# Patient Record
Sex: Male | Born: 1950 | Race: White | Hispanic: No | Marital: Married | Smoking: Never smoker
Health system: Southern US, Community
[De-identification: ages and names within clinical notes are randomized; demographics above are authoritative.]

---

## 1999-08-01 ENCOUNTER — Ambulatory Visit (HOSPITAL_COMMUNITY): Admission: RE | Admit: 1999-08-01 | Discharge: 1999-08-01 | Payer: Self-pay | Admitting: *Deleted

## 2003-07-03 ENCOUNTER — Encounter: Admission: RE | Admit: 2003-07-03 | Discharge: 2003-07-03 | Payer: Self-pay | Admitting: Emergency Medicine

## 2006-06-12 ENCOUNTER — Encounter: Admission: RE | Admit: 2006-06-12 | Discharge: 2006-06-12 | Payer: Self-pay | Admitting: Emergency Medicine

## 2015-11-21 ENCOUNTER — Emergency Department (HOSPITAL_COMMUNITY)
Admission: EM | Admit: 2015-11-21 | Discharge: 2015-11-21 | Disposition: A | Discharge status: Home / Self Care | Payer: Federal, State, Local not specified - PPO | Attending: Emergency Medicine | Admitting: Emergency Medicine

## 2015-11-21 ENCOUNTER — Encounter (HOSPITAL_COMMUNITY): Payer: Self-pay | Admitting: Emergency Medicine

## 2015-11-21 ENCOUNTER — Emergency Department (HOSPITAL_COMMUNITY): Payer: Federal, State, Local not specified - PPO

## 2015-11-21 DIAGNOSIS — Y998 Other external cause status: Secondary | ICD-10-CM | POA: Diagnosis not present

## 2015-11-21 DIAGNOSIS — S61411A Laceration without foreign body of right hand, initial encounter: Secondary | ICD-10-CM | POA: Diagnosis not present

## 2015-11-21 DIAGNOSIS — Y9289 Other specified places as the place of occurrence of the external cause: Secondary | ICD-10-CM | POA: Insufficient documentation

## 2015-11-21 DIAGNOSIS — S61219A Laceration without foreign body of unspecified finger without damage to nail, initial encounter: Secondary | ICD-10-CM

## 2015-11-21 DIAGNOSIS — S6991XA Unspecified injury of right wrist, hand and finger(s), initial encounter: Secondary | ICD-10-CM | POA: Diagnosis present

## 2015-11-21 DIAGNOSIS — W274XXA Contact with kitchen utensil, initial encounter: Secondary | ICD-10-CM | POA: Insufficient documentation

## 2015-11-21 DIAGNOSIS — Y9389 Activity, other specified: Secondary | ICD-10-CM | POA: Diagnosis not present

## 2015-11-21 MED ORDER — LIDOCAINE HCL (PF) 1 % IJ SOLN
20.0000 mL | Freq: Once | INTRAMUSCULAR | Status: DC
  Filled 2015-11-21: qty 20

## 2015-11-21 MED ORDER — LIDOCAINE HCL 1 % IJ SOLN
INTRAMUSCULAR | Status: AC
  Administered 2015-11-21: 20 mL
  Filled 2015-11-21: qty 20

## 2015-11-21 NOTE — ED Notes (Signed)
PT DISCHARGED. INSTRUCTIONS GIVEN. AAOX3. PT IN NO APPARENT DISTRESS OR PAIN. THE OPPORTUNITY TO ASK QUESTIONS WAS PROVIDED. 

## 2015-11-21 NOTE — ED Provider Notes (Signed)
CSN: [id number]     Arrival date & time 11/21/15  2100 History  By signing my name below, I, Doreatha Martinva Mathews, attest that this documentation has been prepared under the direction and in the presence of Marlon Peliffany Jasmond River, PA-C. Electronically Signed: Doreatha MartinEva Mathews, ED Scribe. 11/21/2015. 9:46 PM.    Chief Complaint  Patient presents with  . Extremity Laceration   The history is provided by the patient. No language interpreter was used.   HPI Comments: Edward Lara is a 65 y.o. male who presents to the Emergency Department complaining of a laceration with controlled bleeding to the web space between the 4th and 5th fingers of the right hand that occurred at 7:30PM. Pt states he was washing the dishes when piece of stainless steel slipped and cut him. Bleeding controlled with pressure applied PTA. He is able to move his fingers, but with mild pain. No concern for foreign body. Tdap UTD. Denies numbness, additional injuries.   History reviewed. No pertinent past medical history. No past surgical history on file. History reviewed. No pertinent family history. Social History  Substance Use Topics  . Smoking status: None  . Smokeless tobacco: None  . Alcohol Use: None    Review of Systems  Skin: Positive for wound.  Neurological: Negative for numbness.   Allergies  Review of patient's allergies indicates no known allergies.  Home Medications   Prior to Admission medications   Not on File   BP 183/89 mmHg  Pulse 60  Temp(Src) 97.7 F (36.5 C) (Oral)  Resp 18  Ht 6' (1.829 m)  Wt 87.998 kg  BMI 26.31 kg/m2  SpO2 98% Physical Exam  Constitutional: He is oriented to person, place, and time. He appears well-developed and well-nourished.  HENT:  Head: Normocephalic and atraumatic.  Eyes: Conjunctivae are normal.  Cardiovascular: Normal rate.   Pulmonary/Chest: Effort normal. No respiratory distress.  Abdominal: He exhibits no distension.  Musculoskeletal: Normal range of motion.        Hands: 2 cm laceration to the web space between the 4th and 5th digits of the right hand with hemostasis. Sensation intact distally. Radial pulses 2+ and equal. Capillary refill less than 3 seconds.    Neurological: He is alert and oriented to person, place, and time.  Skin: Skin is warm and dry.  Psychiatric: He has a normal mood and affect. His behavior is normal.  Nursing note and vitals reviewed.   ED Course  Procedures (including critical care time) DIAGNOSTIC STUDIES: Oxygen Saturation is 98% on RA, normal by my interpretation.    COORDINATION OF CARE: 9:40 PM Discussed treatment plan with pt at bedside which includes XR, lac repair and pt agreed to plan.  Imaging Review Dg Hand Complete Right  11/21/2015  CLINICAL DATA:  Cut hand on glass, initial encounter EXAM: RIGHT HAND - COMPLETE 3+ VIEW COMPARISON:  None. FINDINGS: Laceration is noted between the fourth and fifth digits. No radiopaque foreign body is seen. No acute bony abnormality is noted. IMPRESSION: Soft tissue injury without acute bony abnormality. Electronically Signed   By: Alcide CleverMark  Lukens M.D.   On: 11/21/2015 21:43   I have personally reviewed and evaluated these images and lab results as part of my medical decision-making.  LACERATION REPAIR PROCEDURE NOTE The patient's identification was confirmed and consent was obtained. This procedure was performed by Dorthula Matasiffany G [employer], at 10:07 PM. Site: web space b/w 4th and 5th digits of right hand Sterile procedures observed Anesthetic used (type and amt): 1%  lidocaine without epi via local infiltration  Suture type/size: Prolene 6'0 Length: 2 cm  # of Sutures: 5 Technique: simple interrupted Complexity: simple Antibx ointment applied Tetanus UTD  Site anesthetized, irrigated with NS, explored without evidence of foreign body, wound well approximated, site covered with dry, sterile dressing.  Patient tolerated procedure well without complications. Instructions for care  discussed verbally and patient provided with additional written instructions for homecare and f/u.    MDM   Final diagnoses:  Finger laceration, initial encounter    Tetanus UTD. Laceration occurred < 12 hours prior to repair. Discussed laceration care with pt and answered questions. Pt to f-u for suture removal in 10-14 days and wound check sooner should there be signs of dehiscence or infection. Pt is hemodynamically stable with no complaints prior to dc.  Return precautions discussed and outlined in discharge paperwork.   I personally performed the services described in this documentation, which was scribed in my presence. The recorded information has been reviewed and is accurate.   Marlon Pel, PA-C 11/21/15 2212  Melene Plan, DO 11/21/15 2317

## 2015-11-21 NOTE — Discharge Instructions (Signed)
Wound Care °Taking care of your wound properly can help to prevent pain and infection. It can also help your wound to heal more quickly.  °HOW TO CARE FOR YOUR WOUND  °· Take or apply over-the-counter and prescription medicines only as told by your health care provider. °· If you were prescribed antibiotic medicine, take or apply it as told by your health care provider. Do not stop using the antibiotic even if your condition improves. °· Clean the wound each day or as told by your health care provider. °¨ Wash the wound with mild soap and water. °¨ Rinse the wound with water to remove all soap. °¨ Pat the wound dry with a clean towel. Do not rub it. °· There are many different ways to close and cover a wound. For example, a wound can be covered with stitches (sutures), skin glue, or adhesive strips. Follow instructions from your health care provider about: °¨ How to take care of your wound. °¨ When and how you should change your bandage (dressing). °¨ When you should remove your dressing. °¨ Removing whatever was used to close your wound. °· Check your wound every day for signs of infection. Watch for: °¨ Redness, swelling, or pain. °¨ Fluid, blood, or pus. °· Keep the dressing dry until your health care provider says it can be removed. Do not take baths, swim, use a hot tub, or do anything that would put your wound underwater until your health care provider approves. °· Raise (elevate) the injured area above the level of your heart while you are sitting or lying down. °· Do not scratch or pick at the wound. °· Keep all follow-up visits as told by your health care provider. This is important. °SEEK MEDICAL CARE IF: °· You received a tetanus shot and you have swelling, severe pain, redness, or bleeding at the injection site. °· You have a fever. °· Your pain is not controlled with medicine. °· You have increased redness, swelling, or pain at the site of your wound. °· You have fluid, blood, or pus coming from your  wound. °· You notice a bad smell coming from your wound or your dressing. °SEEK IMMEDIATE MEDICAL CARE IF: °· You have a red streak going away from your wound. °  °This information is not intended to replace advice given to you by your health care provider. Make sure you discuss any questions you have with your health care provider. °  °Document Released: 05/09/2008 Document Revised: 12/15/2014 Document Reviewed: 07/27/2014 °Elsevier Interactive Patient Education ©2016 Elsevier Inc. ° °Laceration Care, Adult °A laceration is a cut that goes through all of the layers of the skin and into the tissue that is right under the skin. Some lacerations heal on their own. Others need to be closed with stitches (sutures), staples, skin adhesive strips, or skin glue. Proper laceration care minimizes the risk of infection and helps the laceration to heal better. °HOW TO CARE FOR YOUR LACERATION °If sutures or staples were used: °· Keep the wound clean and dry. °· If you were given a bandage (dressing), you should change it at least one time per day or as told by your health care provider. You should also change it if it becomes wet or dirty. °· Keep the wound completely dry for the first 24 hours or as told by your health care provider. After that time, you may shower or bathe. However, make sure that the wound is not soaked in water until after the sutures or   staples have been removed. °· Clean the wound one time each day or as told by your health care provider: °¨ Wash the wound with soap and water. °¨ Rinse the wound with water to remove all soap. °¨ Pat the wound dry with a clean towel. Do not rub the wound. °· After cleaning the wound, apply a thin layer of antibiotic ointment as told by your health care provider. This will help to prevent infection and keep the dressing from sticking to the wound. °· Have the sutures or staples removed as told by your health care provider. °If skin adhesive strips were used: °· Keep the  wound clean and dry. °· If you were given a bandage (dressing), you should change it at least one time per day or as told by your health care provider. You should also change it if it becomes dirty or wet. °· Do not get the skin adhesive strips wet. You may shower or bathe, but be careful to keep the wound dry. °· If the wound gets wet, pat it dry with a clean towel. Do not rub the wound. °· Skin adhesive strips fall off on their own. You may trim the strips as the wound heals. Do not remove skin adhesive strips that are still stuck to the wound. They will fall off in time. °If skin glue was used: °· Try to keep the wound dry, but you may briefly wet it in the shower or bath. Do not soak the wound in water, such as by swimming. °· After you have showered or bathed, gently pat the wound dry with a clean towel. Do not rub the wound. °· Do not do any activities that will make you sweat heavily until the skin glue has fallen off on its own. °· Do not apply liquid, cream, or ointment medicine to the wound while the skin glue is in place. Using those may loosen the film before the wound has healed. °· If you were given a bandage (dressing), you should change it at least one time per day or as told by your health care provider. You should also change it if it becomes dirty or wet. °· If a dressing is placed over the wound, be careful not to apply tape directly over the skin glue. Doing that may cause the glue to be pulled off before the wound has healed. °· Do not pick at the glue. The skin glue usually remains in place for 5-10 days, then it falls off of the skin. °General Instructions °· Take over-the-counter and prescription medicines only as told by your health care provider. °· If you were prescribed an antibiotic medicine or ointment, take or apply it as told by your doctor. Do not stop using it even if your condition improves. °· To help prevent scarring, make sure to cover your wound with sunscreen whenever you are  outside after stitches are removed, after adhesive strips are removed, or when glue remains in place and the wound is healed. Make sure to wear a sunscreen of at least 30 SPF. °· Do not scratch or pick at the wound. °· Keep all follow-up visits as told by your health care provider. This is important. °· Check your wound every day for signs of infection. Watch for: °¨ Redness, swelling, or pain. °¨ Fluid, blood, or pus. °· Raise (elevate) the injured area above the level of your heart while you are sitting or lying down, if possible. °SEEK MEDICAL CARE IF: °· You received a tetanus   shot and you have swelling, severe pain, redness, or bleeding at the injection site. °· You have a fever. °· A wound that was closed breaks open. °· You notice a bad smell coming from your wound or your dressing. °· You notice something coming out of the wound, such as wood or glass. °· Your pain is not controlled with medicine. °· You have increased redness, swelling, or pain at the site of your wound. °· You have fluid, blood, or pus coming from your wound. °· You notice a change in the color of your skin near your wound. °· You need to change the dressing frequently due to fluid, blood, or pus draining from the wound. °· You develop a new rash. °· You develop numbness around the wound. °SEEK IMMEDIATE MEDICAL CARE IF: °· You develop severe swelling around the wound. °· Your pain suddenly increases and is severe. °· You develop painful lumps near the wound or on skin that is anywhere on your body. °· You have a red streak going away from your wound. °· The wound is on your hand or foot and you cannot properly move a finger or toe. °· The wound is on your hand or foot and you notice that your fingers or toes look pale or bluish. °  °This information is not intended to replace advice given to you by your health care provider. Make sure you discuss any questions you have with your health care provider. °  °Document Released: 07/31/2005  Document Revised: 12/15/2014 Document Reviewed: 07/27/2014 °Elsevier Interactive Patient Education ©2016 Elsevier Inc. ° °

## 2015-11-21 NOTE — ED Notes (Signed)
Pt states that he cut his R hand in between his ring finger and small finger on a glass while washing dishes. Last tetanus shot 4-5 years ago. Alert and oriented.

## 2015-12-04 ENCOUNTER — Ambulatory Visit (INDEPENDENT_AMBULATORY_CARE_PROVIDER_SITE_OTHER): Payer: Federal, State, Local not specified - PPO | Admitting: Physician Assistant

## 2015-12-04 VITALS — BP 130/76 | HR 61 | Temp 98.1°F | Resp 14 | Ht 72.0 in | Wt 179.0 lb

## 2015-12-04 DIAGNOSIS — Z4802 Encounter for removal of sutures: Secondary | ICD-10-CM

## 2015-12-04 NOTE — Patient Instructions (Signed)
     IF you received an x-ray today, you will receive an invoice from [facility]. Please contact [facility] at [phone] with questions or concerns regarding your invoice.   IF you received labwork today, you will receive an invoice from [facility]. Please contact [facility] at [phone] with questions or concerns regarding your invoice.   Our billing staff will not be able to assist you with questions regarding bills from these companies.  You will be contacted with the lab results as soon as they are available. The fastest way to get your results is to activate your My Chart account. Instructions are located on the last page of this paperwork. If you have not heard from us regarding the results in 2 weeks, please contact this office.      

## 2015-12-04 NOTE — Progress Notes (Signed)
Chief Complaint  Patient presents with  . Suture / Staple Removal    History of Present Illness: Patient presents for suture removal.  The patient presented to the ED on 11/21/2015 after accidentally cutting the webspace between the 4th and 5th fingers on the RIGHT hand while washing dishes. The wound was closed with #5 SI Prolene sutures.  He denies pain and drainage, fever and chills.  Yesterday he noted the development of a little erythema and edema at the site, which he believes is reaction to the sutures.   No Known Allergies  Prior to Admission medications   Not on File    There are no active problems to display for this patient.    Physical Exam  Constitutional: He is oriented to person, place, and time. He appears well-developed and well-nourished. He is active and cooperative. No distress.  BP 130/76 mmHg  Pulse 61  Temp(Src) 98.1 F (36.7 C) (Oral)  Resp 14  Ht 6' (1.829 m)  Wt 179 lb (81.194 kg)  BMI 24.27 kg/m2  SpO2 98%   Eyes: Conjunctivae are normal.  Pulmonary/Chest: Effort normal.  Neurological: He is alert and oriented to person, place, and time.  Skin: Skin is warm and dry. Laceration (#5 SI Prolene sutures are intact. Minimal erythema/edema, consistent with suture reaction. no drainage. non-tender.) noted.  Sutures removed without incident. The wound edges were mildly inverted, but deeper in the wound, the tissue is well healed.  Psychiatric: He has a normal mood and affect. His speech is normal and behavior is normal.      ASSESSMENT & PLAN:  1. Visit for suture removal Anticipatory guidance provided. Local wound care.   Fernande Brashelle S. Nakiea Metzner, PA-C Physician Assistant-Certified [facility]

## 2016-02-05 ENCOUNTER — Emergency Department (HOSPITAL_COMMUNITY): Payer: Federal, State, Local not specified - PPO

## 2016-02-05 ENCOUNTER — Encounter (HOSPITAL_COMMUNITY): Payer: Self-pay | Admitting: *Deleted

## 2016-02-05 ENCOUNTER — Emergency Department (HOSPITAL_COMMUNITY)
Admission: EM | Admit: 2016-02-05 | Discharge: 2016-02-05 | Disposition: A | Discharge status: Home / Self Care | Payer: Federal, State, Local not specified - PPO | Attending: Emergency Medicine | Admitting: Emergency Medicine

## 2016-02-05 DIAGNOSIS — Y939 Activity, unspecified: Secondary | ICD-10-CM | POA: Diagnosis not present

## 2016-02-05 DIAGNOSIS — W271XXA Contact with garden tool, initial encounter: Secondary | ICD-10-CM | POA: Insufficient documentation

## 2016-02-05 DIAGNOSIS — S91031A Puncture wound without foreign body, right ankle, initial encounter: Secondary | ICD-10-CM | POA: Insufficient documentation

## 2016-02-05 DIAGNOSIS — L03115 Cellulitis of right lower limb: Secondary | ICD-10-CM | POA: Diagnosis not present

## 2016-02-05 DIAGNOSIS — Y998 Other external cause status: Secondary | ICD-10-CM | POA: Diagnosis not present

## 2016-02-05 DIAGNOSIS — T148XXA Other injury of unspecified body region, initial encounter: Secondary | ICD-10-CM

## 2016-02-05 DIAGNOSIS — Y92007 Garden or yard of unspecified non-institutional (private) residence as the place of occurrence of the external cause: Secondary | ICD-10-CM | POA: Diagnosis not present

## 2016-02-05 DIAGNOSIS — L0390 Cellulitis, unspecified: Secondary | ICD-10-CM

## 2016-02-05 MED ORDER — AMOXICILLIN-POT CLAVULANATE 875-125 MG PO TABS: 1.0000 | ORAL_TABLET | Freq: Two times a day (BID) | ORAL | Status: AC

## 2016-02-05 MED ORDER — TETANUS-DIPHTH-ACELL PERTUSSIS 5-2.5-18.5 LF-MCG/0.5 IM SUSP
0.5000 mL | Freq: Once | INTRAMUSCULAR | Status: AC
  Administered 2016-02-05: 0.5 mL via INTRAMUSCULAR
  Filled 2016-02-05: qty 0.5

## 2016-02-05 MED ORDER — AMOXICILLIN-POT CLAVULANATE 875-125 MG PO TABS
1.0000 | ORAL_TABLET | Freq: Once | ORAL | Status: AC
  Administered 2016-02-05: 1 via ORAL
  Filled 2016-02-05: qty 1

## 2016-02-05 NOTE — Discharge Instructions (Signed)
The x-ray shows no fracture or retained foreign body  You have been started on antibiotic .  Please take this as directed until all tablets have been completed .   The area of redness in the posterior ankle has been outlined if the redness extends to or beyond this line, you develop fever , body aches , please return for further evaluation and treatment   As make an appointment with your primary care physician for follow-up   Cellulitis Cellulitis is an infection of the skin and the tissue under the skin. The infected area is usually red and tender. This happens most often in the arms and lower legs. HOME CARE   Take your antibiotic medicine as told. Finish the medicine even if you start to feel better.  Keep the infected arm or leg raised (elevated).  Put a warm cloth on the area up to 4 times per day.  Only take medicines as told by your doctor.  Keep all doctor visits as told. GET HELP IF:  You see red streaks on the skin coming from the infected area.  Your red area gets bigger or turns a dark color.  Your bone or joint under the infected area is painful after the skin heals.  Your infection comes back in the same area or different area.  You have a puffy (swollen) bump in the infected area.  You have new symptoms.  You have a fever. GET HELP RIGHT AWAY IF:   You feel very sleepy.  You throw up (vomit) or have watery poop (diarrhea).  You feel sick and have muscle aches and pains.   This information is not intended to replace advice given to you by your health care provider. Make sure you discuss any questions you have with your health care provider.   Document Released: 01/17/2008 Document Revised: 04/21/2015 Document Reviewed: 10/16/2011 Elsevier Interactive Patient Education [employer]2016 Elsevier Inc.

## 2016-02-05 NOTE — ED Provider Notes (Signed)
CSN: [id number]     Arrival date & time 02/05/16  1334 History  By signing my name below, I, Ronney LionSuzanne Le, attest that this documentation has been prepared under the direction and in the presence of Earley FavorGail Rosalio Catterton, NP. Electronically Signed: Ronney LionSuzanne Le, ED Scribe. 02/05/2016. 2:57 PM.    Chief Complaint  Patient presents with  . Wound Infection   The history is provided by the patient. No language interpreter was used.    HPI Comments: Edward Lara is a 65 y.o. male who presents to the Emergency Department complaining of gradual-onset, constant, worsening, moderate, redness, swelling, and pain to his right anterolateral ankle after accidentally stabbing his foot with a pitchfork 4 days ago. He states his pain has been worsening since onset, and he reports this morning, he felt unable to bear weight secondary to his pain. Patient states he had not tried anything for his symptoms. He denies fever, chills, or generalized myalgias.    [facility]  History reviewed. No pertinent past medical history. History reviewed. No pertinent past surgical history. No family history on file. Social History  Substance Use Topics  . Smoking status: Never Smoker   . Smokeless tobacco: None  . Alcohol Use: None    Review of Systems  Constitutional: Negative for fever and chills.  Musculoskeletal: Negative for myalgias.  Skin: Positive for color change (redness) and wound.  All other systems reviewed and are negative.  Allergies  Review of patient's allergies indicates no known allergies.  Home Medications   Prior to Admission medications   Not on File   BP 157/95 mmHg  Pulse 95  Temp(Src) 98.1 F (36.7 C)  Resp 18  SpO2 97% Physical Exam  Constitutional: He is oriented to person, place, and time. He appears well-developed and well-nourished. No distress.  HENT:  Head: Normocephalic and atraumatic.  Eyes: Conjunctivae and EOM are normal.  Neck: Neck supple. No tracheal deviation  present.  Cardiovascular: Normal rate.   Pulmonary/Chest: Effort normal. No respiratory distress.  Musculoskeletal: Normal range of motion.  Neurological: He is alert and oriented to person, place, and time.  Skin: Skin is warm and dry.  Psychiatric: He has a normal mood and affect. His behavior is normal.  Nursing note and vitals reviewed.   ED Course  Procedures (including critical care time)  DIAGNOSTIC STUDIES: Oxygen Saturation is 97% on RA, normal by my interpretation.    COORDINATION OF CARE: 2:19 PM - Discussed treatment plan with pt at bedside which includes Augmentin, x-ray of right lower extremity to rule out fracture, and update of Tdap. Offered pain medication; pt declined, stating, "I almost never take medicine for my pain." Pt verbalized understanding and agreed to plan.   Imaging Review No results found. I have personally reviewed and evaluated these images and lab results as part of my medical decision-making. *Shows no foreign body retention.  No fracture.  Patient will be started on Augmentin.  The area of redness has been outlined.  He's been instructed to return if the erythema extends past this area develops myalgias, fevers or increased pain in the ankle joint itself  MDM   Final diagnoses:  None    I personally performed the services described in this documentation, which was scribed in my presence. The recorded information has been reviewed and is accurate.    Earley FavorGail Carey Johndrow, NP 02/05/16 1526  Lyndal Pulleyaniel Knott, MD 02/07/16 [phone]

## 2016-02-05 NOTE — ED Notes (Signed)
Pt complains of redness and swelling to right foot. Pt states he has a puncture would from a pitchfork 4 days ago. Pt denies drainage from wound.

## 2016-08-27 ENCOUNTER — Emergency Department (HOSPITAL_COMMUNITY)
Admission: EM | Admit: 2016-08-27 | Discharge: 2016-08-27 | Disposition: A | Discharge status: Home / Self Care | Payer: Federal, State, Local not specified - PPO | Attending: Emergency Medicine | Admitting: Emergency Medicine

## 2016-08-27 ENCOUNTER — Encounter (HOSPITAL_COMMUNITY): Payer: Self-pay | Admitting: Emergency Medicine

## 2016-08-27 DIAGNOSIS — R55 Syncope and collapse: Secondary | ICD-10-CM | POA: Diagnosis not present

## 2016-08-27 DIAGNOSIS — Z79899 Other long term (current) drug therapy: Secondary | ICD-10-CM | POA: Insufficient documentation

## 2016-08-27 DIAGNOSIS — F439 Reaction to severe stress, unspecified: Secondary | ICD-10-CM | POA: Diagnosis not present

## 2016-08-27 LAB — URINALYSIS, ROUTINE W REFLEX MICROSCOPIC
Bilirubin Urine: NEGATIVE
Glucose, UA: NEGATIVE mg/dL
Hgb urine dipstick: NEGATIVE
Ketones, ur: NEGATIVE mg/dL
Leukocytes, UA: NEGATIVE
Nitrite: NEGATIVE
Protein, ur: NEGATIVE mg/dL
Specific Gravity, Urine: 1.023 (ref 1.005–1.030)
pH: 5 (ref 5.0–8.0)

## 2016-08-27 LAB — CBC
HCT: 43.8 % (ref 39.0–52.0)
Hemoglobin: 15.4 g/dL (ref 13.0–17.0)
MCH: 30.8 pg (ref 26.0–34.0)
MCHC: 35.2 g/dL (ref 30.0–36.0)
MCV: 87.6 fL (ref 78.0–100.0)
Platelets: 253 10*3/uL (ref 150–400)
RBC: 5 MIL/uL (ref 4.22–5.81)
RDW: 12.7 % (ref 11.5–15.5)
WBC: 11.7 10*3/uL — ABNORMAL HIGH (ref 4.0–10.5)

## 2016-08-27 LAB — BASIC METABOLIC PANEL
Anion gap: 8 (ref 5–15)
BUN: 22 mg/dL — ABNORMAL HIGH (ref 6–20)
CO2: 28 mmol/L (ref 22–32)
Calcium: 9.4 mg/dL (ref 8.9–10.3)
Chloride: 104 mmol/L (ref 101–111)
Creatinine, Ser: 0.94 mg/dL (ref 0.61–1.24)
GFR calc Af Amer: >60 mL/min (ref >60)
GFR calc non Af Amer: >60 mL/min (ref >60)
Glucose, Bld: 106 mg/dL — ABNORMAL HIGH (ref 65–99)
Potassium: 3.9 mmol/L (ref 3.5–5.1)
Sodium: 140 mmol/L (ref 135–145)

## 2016-08-27 NOTE — ED Provider Notes (Addendum)
[facility]-EMERGENCY DEPT Provider Note   CSN: [id number] Arrival date & time: 08/27/16  1641     History   Chief Complaint Chief Complaint  Patient presents with  . Loss of Consciousness    HPI Edward Lara is a 66 y.o. male.  He is here for evaluation of an episode of syncope which occurred yesterday morning. He recalls being diaphoretic, and feeling dizzy, just prior to passing out. He was with his wife at the time and she states he was out for "a few seconds". Subsequent to that, he was able to carry out his usual daily activities including eating, walking, sleeping, and observations both in and outside of the house. After passing out the patient was worried that he was dying, so he and his wife talked about that and he showed her where some papers would be if she needed them. The patient reports having stress because his 20 year old, mother is requiring more assistance, and may need to be placed. He is her primary caregiver. He admits to smoking some marijuana prior to the episode of syncope. There are no other known modifying factors.   HPI  History reviewed. No pertinent past medical history.  There are no active problems to display for this patient.   History reviewed. No pertinent surgical history.     Home Medications    Prior to Admission medications   Medication Sig Start Date End Date Taking? Authorizing Provider  amoxicillin-clavulanate (AUGMENTIN) 875-125 MG tablet Take 1 tablet by mouth 2 (two) times daily. 02/05/16   Earley Favor, NP    Family History History reviewed. No pertinent family history.  Social History Social History  Substance Use Topics  . Smoking status: Never Smoker  . Smokeless tobacco: Not on file  . Alcohol use Not on file     Allergies   Patient has no known allergies.   Review of Systems Review of Systems   Physical Exam Updated Vital Signs BP 130/68   Pulse 60   Temp 98.3 F (36.8 C) (Oral)   Resp 18   SpO2 98%    Physical Exam  Constitutional: He is oriented to person, place, and time. He appears well-developed and well-nourished.  HENT:  Head: Normocephalic and atraumatic.  Right Ear: External ear normal.  Left Ear: External ear normal.  Eyes: Conjunctivae and EOM are normal. Pupils are equal, round, and reactive to light.  Neck: Normal range of motion and phonation normal. Neck supple.  Cardiovascular: Normal rate, regular rhythm and normal heart sounds.   Pulmonary/Chest: Effort normal and breath sounds normal. He exhibits no bony tenderness.  Abdominal: Soft. There is no tenderness.  Musculoskeletal: Normal range of motion.  Neurological: He is alert and oriented to person, place, and time. No cranial nerve deficit or sensory deficit. He exhibits normal muscle tone. Coordination normal.  Skin: Skin is warm, dry and intact.  Psychiatric: He has a normal mood and affect. His behavior is normal. Judgment and thought content normal.  Nursing note and vitals reviewed.    ED Treatments / Results  Labs (all labs ordered are listed, but only abnormal results are displayed) Labs Reviewed  BASIC METABOLIC PANEL - Abnormal; Notable for the following:       Result Value   Glucose, Bld 106 (*)    BUN 22 (*)    All other components within normal limits  CBC - Abnormal; Notable for the following:    WBC 11.7 (*)    All other components within normal  limits  URINALYSIS, ROUTINE W REFLEX MICROSCOPIC    EKG  EKG Interpretation  Date/Time:  Sunday August 27 2016 17:04:30 EST Ventricular Rate:  61 PR Interval:    QRS Duration: 114 QT Interval:  417 QTC Calculation: 420 R Axis:   71 Text Interpretation:  Sinus rhythm Borderline intraventricular conduction delay No old tracing to compare Confirmed by [facility]  MD, Morley Gaumer [phone]([id number]) on 08/27/2016 5:55:41 PM Also confirmed by Effie ShyWENTZ  MD, Treg Diemer [phone]), editor Stout CT, Jola BabinskiMarilyn [phone]([id number])  on 08/28/2016 8:07:19 AM       Radiology No results  found.  Procedures Procedures (including critical care time)  Medications Ordered in ED Medications - No data to display   Initial Impression / Assessment and Plan / ED Course  I have reviewed the triage vital signs and the nursing notes.  Pertinent labs & imaging results that were available during my care of the patient were reviewed by me and considered in my medical decision making (see chart for details).  Clinical Course as of Sep 13 1638  Wynelle LinkSun Aug 27, 2016  2217 EKG 12-Lead [EW]  2218 EKG 12-Lead [EW]    Clinical Course User Index [EW] Mancel BaleElliott Ramia Sidney, MD    Medications - No data to display  No data found.   10:46 PM Reevaluation with update and discussion. After initial assessment and treatment, an updated evaluation reveals no change in clinical status. Findings discussed with patient and wife, all questions were answered. [name]    Final Clinical Impressions(s) / ED Diagnoses   Final diagnoses:  Vasovagal syncope  Stress    Syncope, likely vasovagal in origin, associated with stress, unlikely to be caused from smoking marijuana. Evaluation for CNS or cardiac problems is negative. No indication for further evaluation or treatment in the ED, or hospital setting. Mild blood pressure elevation, unlikely to be causative or problematic.  Nursing Notes Reviewed/ Care Coordinated Applicable Imaging Reviewed Interpretation of Laboratory Data incorporated into ED treatment  The patient appears reasonably screened and/or stabilized for discharge and I doubt any other medical condition or other [facility] requiring further screening, evaluation, or treatment in the ED at this time prior to discharge.  Plan: Home Medications- continue; Home Treatments- rest; return here if the recommended treatment, does not improve the symptoms; Recommended follow up- PCP 1-2 weeks for BP check    New Prescriptions Discharge Medication List as of 08/27/2016 10:57 PM       Mancel BaleElliott  Myeesha Shane, MD 08/27/16 2249    Mancel BaleElliott Alyxis Grippi, MD 09/13/16 [phone]

## 2016-08-27 NOTE — Discharge Instructions (Signed)
If you feel dizzy, sat down.  Make sure that you're getting plenty to eat and drink.  Follow-up for a blood pressure check with your PCP, in 1 or 2 weeks.

## 2016-08-27 NOTE — ED Triage Notes (Addendum)
Pt c/o syncopal episode with diaphoresis x several seconds yesterday morning on waking up. Prior to syncope felt lightheaded, diaphoresis. No pain. Has had unusually high stress levels. Recent URI x past few days, resolving.

## 2019-10-05 ENCOUNTER — Ambulatory Visit: Payer: Federal, State, Local not specified - PPO | Attending: Internal Medicine

## 2019-10-05 DIAGNOSIS — Z23 Encounter for immunization: Secondary | ICD-10-CM | POA: Insufficient documentation

## 2019-10-05 NOTE — Progress Notes (Signed)
   Covid-19 Vaccination Clinic  Name:  Edward Lara    MRN: [id number] DOB: Dec 13, 1950  10/05/2019  [name] was observed post Covid-19 immunization for 15 minutes without incidence. He was provided with Vaccine Information Sheet and instruction to access the V-Safe system.   [name] was instructed to call 911 with any severe reactions post vaccine: Marland Kitchen Difficulty breathing  . Swelling of your face and throat  . A fast heartbeat  . A bad rash all over your body  . Dizziness and weakness    Immunizations Administered    Name Date Dose VIS Date Route   Pfizer COVID-19 Vaccine 10/05/2019  8:48 AM 0.3 mL 07/25/2019 Intramuscular   Manufacturer: [employer], [employer]   Lot: [id number]   NDC: [address]-8

## 2019-10-29 ENCOUNTER — Ambulatory Visit: Payer: Federal, State, Local not specified - PPO | Attending: Internal Medicine

## 2019-10-29 DIAGNOSIS — Z23 Encounter for immunization: Secondary | ICD-10-CM

## 2019-10-29 NOTE — Progress Notes (Signed)
   Covid-19 Vaccination Clinic  Name:  KARAM DUNSON    MRN: [id number] DOB: [birth year]  10/29/2019  Mr. Ouellet was observed post Covid-19 immunization for 15 minutes without incident. He was provided with Vaccine Information Sheet and instruction to access the V-Safe system.   Mr. Cicero was instructed to call 911 with any severe reactions post vaccine: Marland Kitchen Difficulty breathing  . Swelling of face and throat  . A fast heartbeat  . A bad rash all over body  . Dizziness and weakness   Immunizations Administered    Name Date Dose VIS Date Route   Pfizer COVID-19 Vaccine 10/29/2019  8:23 AM 0.3 mL 07/25/2019 Intramuscular   Manufacturer: [employer], [employer]   Lot: [id number]   NDC: [address]-3

## 2021-04-29 ENCOUNTER — Other Ambulatory Visit (HOSPITAL_BASED_OUTPATIENT_CLINIC_OR_DEPARTMENT_OTHER): Payer: Self-pay | Admitting: Family Medicine

## 2021-04-29 DIAGNOSIS — E7800 Pure hypercholesterolemia, unspecified: Secondary | ICD-10-CM

## 2021-05-02 ENCOUNTER — Other Ambulatory Visit: Payer: Self-pay

## 2021-05-02 ENCOUNTER — Ambulatory Visit (HOSPITAL_BASED_OUTPATIENT_CLINIC_OR_DEPARTMENT_OTHER)
Admission: RE | Admit: 2021-05-02 | Discharge: 2021-05-02 | Disposition: A | Discharge status: Home / Self Care | Payer: Federal, State, Local not specified - PPO | Source: Ambulatory Visit | Attending: Family Medicine | Admitting: Family Medicine

## 2021-05-02 DIAGNOSIS — E7800 Pure hypercholesterolemia, unspecified: Secondary | ICD-10-CM | POA: Insufficient documentation

## 2023-06-03 IMAGING — CT CT CARDIAC CORONARY ARTERY CALCIUM SCORE
3 series · 14 of 20 positions shown, 16 images · non-contrast
Comparison: None.
COMPARISON: None.

Addendum:
EXAM:
OVER-READ INTERPRETATION  CT CHEST

The following report is an over-read performed by radiologist Dr.
Giorgi Jumper [REDACTED] on 05/02/2021. This
over-read does not include interpretation of cardiac or coronary
anatomy or pathology. The coronary calcium score interpretation by
the cardiologist is attached.
CLINICAL DATA: Risk stratification
Coronary Calcium Score
TECHNIQUE: The patient was scanned on a Siemens Somatom 64 slice scanner. Axial
non-contrast 3 mm slices were carried out through the heart. The
data set was analyzed on a dedicated work station and scored using
the Agatson method.

[Series 2: ax lung · axial · 0.86mm/px · z∈[+827,+939]mm · 5 of 86 slices shown]
[im 15/86  lung]
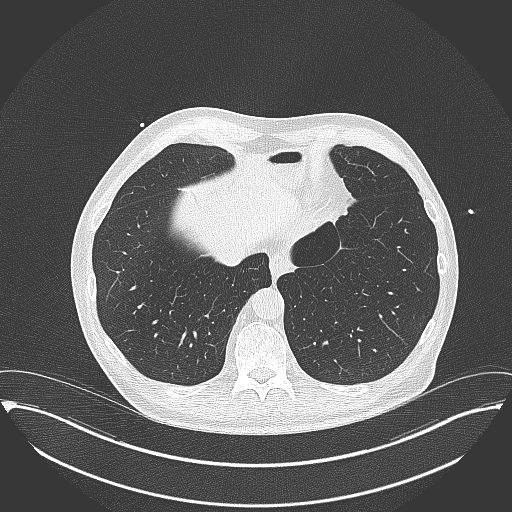
[im 29/86  lung]
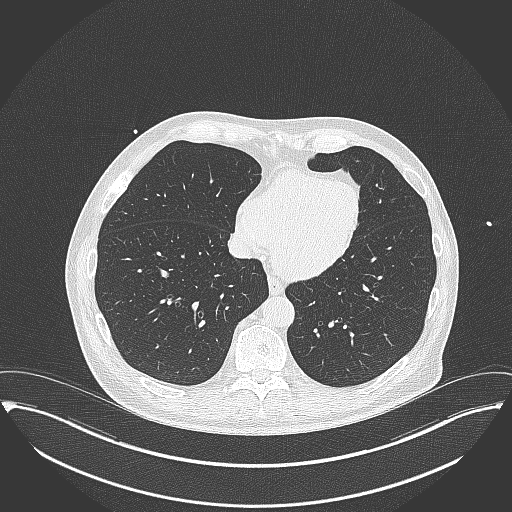
[im 43/86  lung]
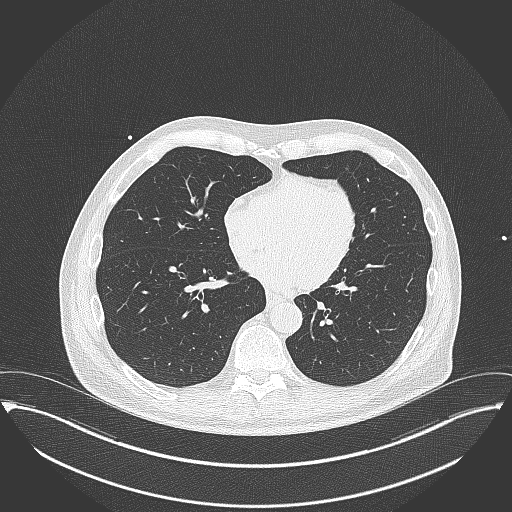
[im 57/86  lung]
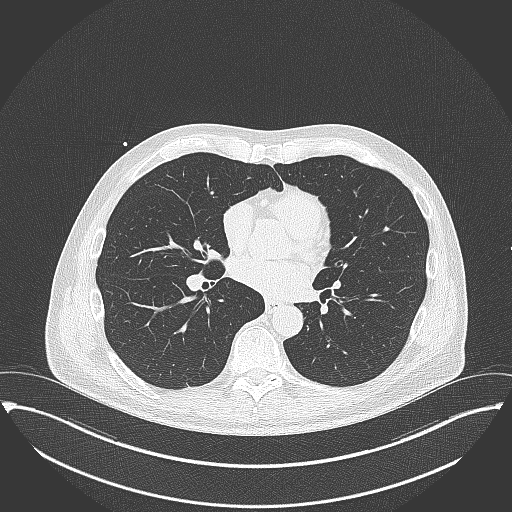
[im 71/86  lung]
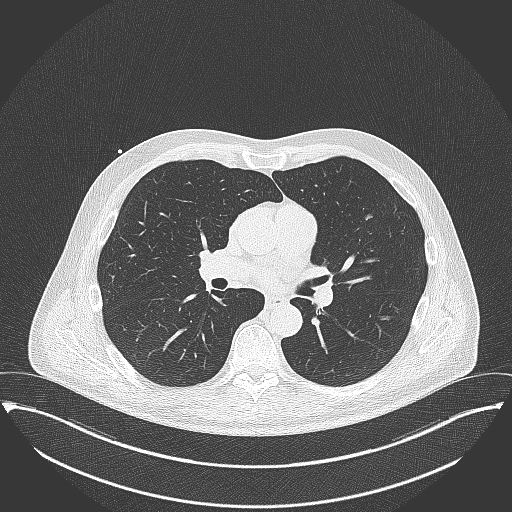

[Series 3: cascseq 3.0 sa36 70% (id) · axial · 0.38mm/px · z∈[+839,+920]mm · 3 of 55 slices shown]
[im 14/55  vessel]
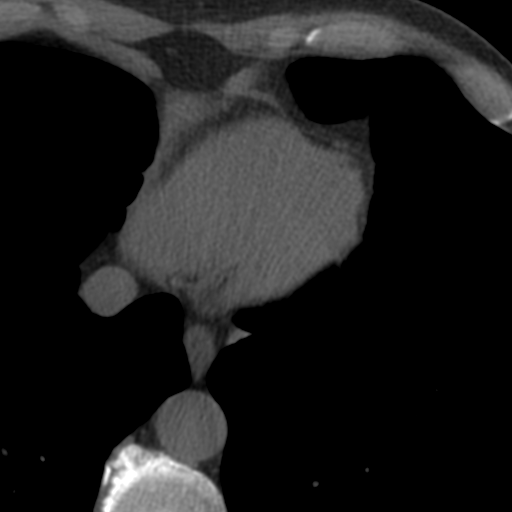
[im 28/55  vessel]
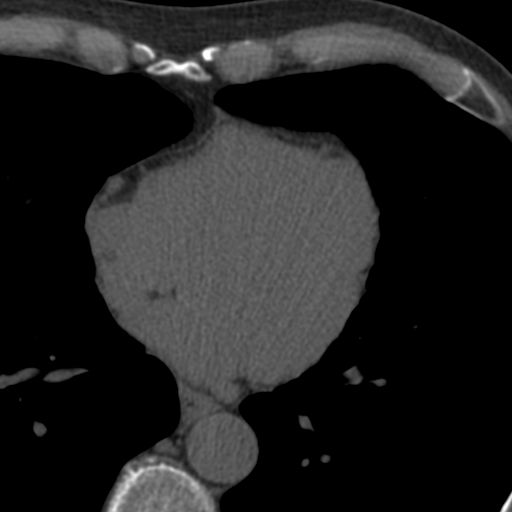
[im 41/55  vessel]
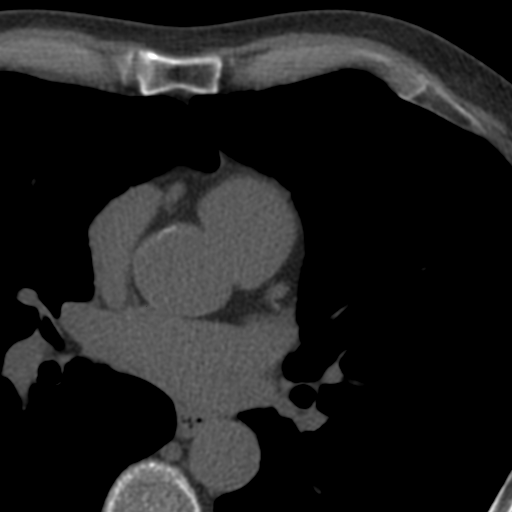

[Series 4: ax st · axial · 0.66mm/px · z∈[+823,+943]mm · 6 of 86 slices shown, 8 images]
[im 13/86  vessel]
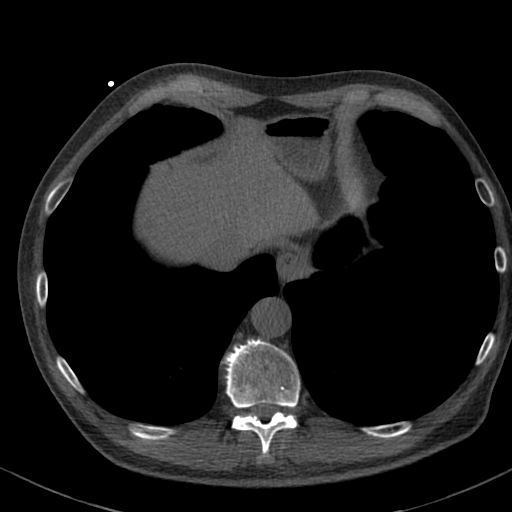
[im 13/86  lung]
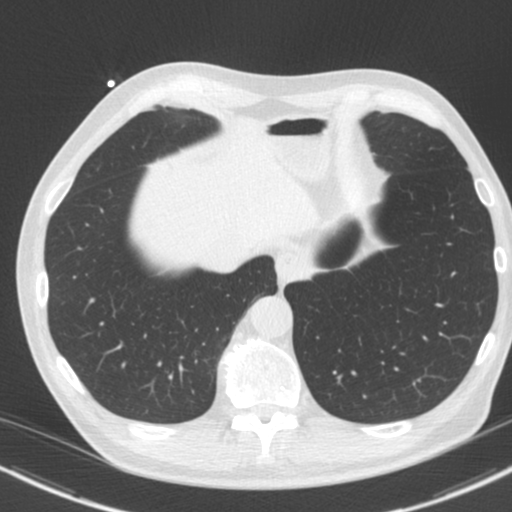
[im 25/86  vessel]
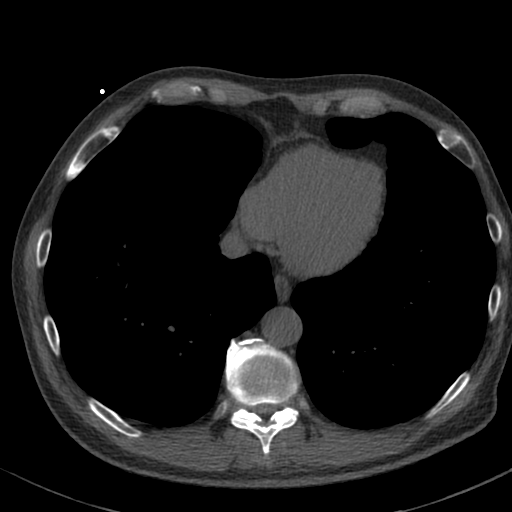
[im 37/86  vessel]
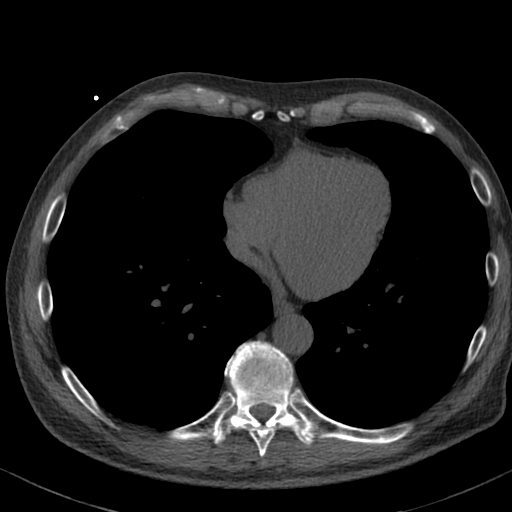
[im 49/86  vessel]
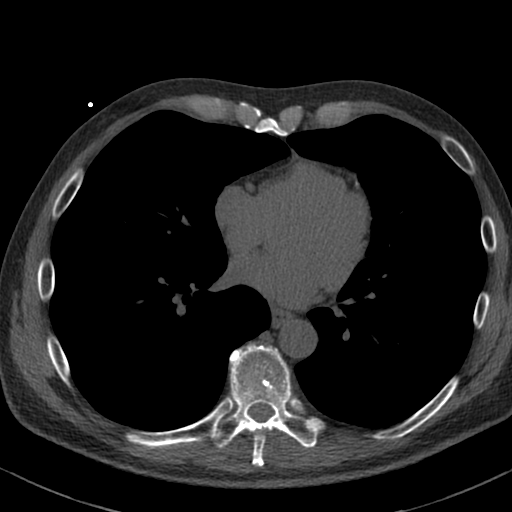
[im 61/86  vessel]
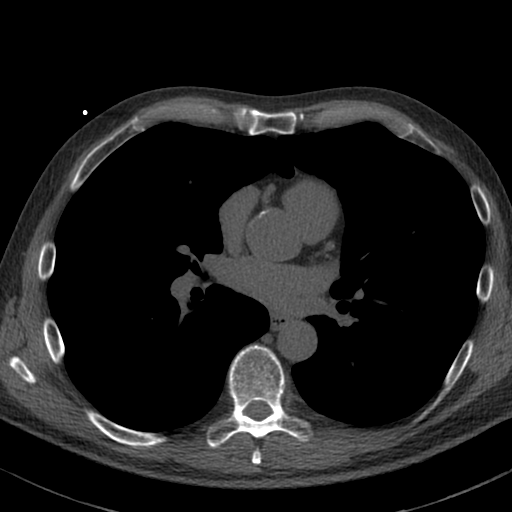
[im 61/86  lung]
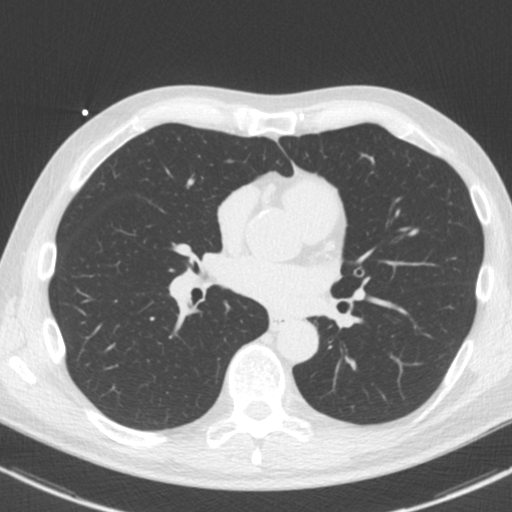
[im 73/86  vessel]
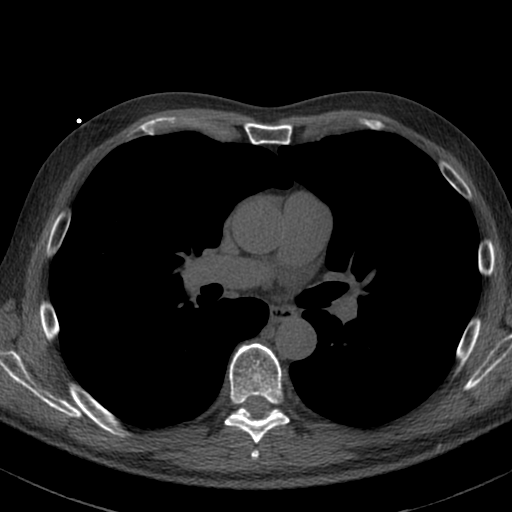

[14 of 20 positions shown; findings below may reference images not displayed]

FINDINGS: Atherosclerotic calcifications in the thoracic aorta. Within the
visualized portions of the thorax there are no suspicious appearing
pulmonary nodules or masses, there is no acute consolidative
airspace disease, no pleural effusions, no pneumothorax and no
lymphadenopathy. Visualized portions of the upper abdomen are
unremarkable. There are no aggressive appearing lytic or blastic
lesions noted in the visualized portions of the skeleton.
IMPRESSION: 1.  Aortic Atherosclerosis (32WU1-764.4).
FINDINGS: Non-cardiac: See separate report from [REDACTED].

Ascending aorta: Normal diameter 3.4 cm

Pericardium: Normal

Coronary arteries: Mild calcium noted in the proximal RCA and LAD

LM: 0

LAD

RCA

LCX: 0

Total:
IMPRESSION: Coronary calcium score of 4.65. This was 19 th percentile for age
and sex matched control.

Jeffrey Guay

*** End of Addendum ***
EXAM:
OVER-READ INTERPRETATION  CT CHEST

The following report is an over-read performed by radiologist Dr.
Giorgi Jumper [REDACTED] on 05/02/2021. This
over-read does not include interpretation of cardiac or coronary
anatomy or pathology. The coronary calcium score interpretation by
the cardiologist is attached.
FINDINGS: Atherosclerotic calcifications in the thoracic aorta. Within the
visualized portions of the thorax there are no suspicious appearing
pulmonary nodules or masses, there is no acute consolidative
airspace disease, no pleural effusions, no pneumothorax and no
lymphadenopathy. Visualized portions of the upper abdomen are
unremarkable. There are no aggressive appearing lytic or blastic
lesions noted in the visualized portions of the skeleton.
IMPRESSION: 1.  Aortic Atherosclerosis (32WU1-764.4).
